# Patient Record
Sex: Female | Born: 1980 | Hispanic: No | Marital: Married | Smoking: Current every day smoker
Health system: Southern US, Community
[De-identification: ages and names within clinical notes are randomized; demographics above are authoritative.]

---

## 2005-12-11 ENCOUNTER — Ambulatory Visit: Payer: Self-pay | Admitting: Family Medicine

## 2006-04-04 ENCOUNTER — Ambulatory Visit: Payer: Self-pay | Admitting: Obstetrics and Gynecology

## 2006-06-22 ENCOUNTER — Observation Stay: Payer: Self-pay | Admitting: Certified Nurse Midwife

## 2006-06-24 ENCOUNTER — Inpatient Hospital Stay: Payer: Self-pay | Admitting: Obstetrics and Gynecology

## 2008-05-10 ENCOUNTER — Ambulatory Visit: Payer: Self-pay | Admitting: Certified Nurse Midwife

## 2008-06-21 ENCOUNTER — Ambulatory Visit: Payer: Self-pay | Admitting: Certified Nurse Midwife

## 2008-08-19 ENCOUNTER — Ambulatory Visit: Payer: Self-pay | Admitting: Family Medicine

## 2008-09-16 ENCOUNTER — Ambulatory Visit: Payer: Self-pay | Admitting: Family Medicine

## 2008-11-10 ENCOUNTER — Inpatient Hospital Stay: Payer: Self-pay | Admitting: Obstetrics and Gynecology

## 2015-11-20 ENCOUNTER — Ambulatory Visit
Admission: EM | Admit: 2015-11-20 | Discharge: 2015-11-20 | Disposition: A | Discharge status: Home / Self Care | Payer: Medicaid Other | Attending: Family Medicine | Admitting: Family Medicine

## 2015-11-20 ENCOUNTER — Encounter: Payer: Self-pay | Admitting: *Deleted

## 2015-11-20 DIAGNOSIS — R05 Cough: Secondary | ICD-10-CM | POA: Diagnosis present

## 2015-11-20 DIAGNOSIS — R509 Fever, unspecified: Secondary | ICD-10-CM | POA: Insufficient documentation

## 2015-11-20 DIAGNOSIS — B349 Viral infection, unspecified: Secondary | ICD-10-CM | POA: Diagnosis not present

## 2015-11-20 DIAGNOSIS — F17210 Nicotine dependence, cigarettes, uncomplicated: Secondary | ICD-10-CM | POA: Insufficient documentation

## 2015-11-20 LAB — RAPID INFLUENZA A&B ANTIGENS
Influenza A (ARMC): NEGATIVE
Influenza B (ARMC): NEGATIVE

## 2015-11-20 MED ORDER — OSELTAMIVIR PHOSPHATE 75 MG PO CAPS: 75.0000 mg | ORAL_CAPSULE | Freq: Two times a day (BID) | ORAL | Status: AC

## 2015-11-20 NOTE — ED Notes (Signed)
Fever up to 103, non-productive cough, body aches, runny nose headache since Saturday night. Pt's son dx with flu last week on Tamiflu.

## 2015-11-20 NOTE — ED Provider Notes (Signed)
CSN: [id number]     Arrival date & time 11/20/15  0940 History   First MD Initiated Contact with Patient 11/20/15 1048     Chief Complaint  Patient presents with  . Fever  . Cough  . Generalized Body Aches  . Nasal Congestion  . Headache   (Consider location/radiation/quality/duration/timing/severity/associated sxs/prior Treatment) Patient is a 35 y.o. female presenting with fever, cough, headaches, and URI. The history is provided by the patient.  Fever Associated symptoms: congestion, cough, headaches, myalgias and rhinorrhea   Cough Associated symptoms: fever, headaches, myalgias and rhinorrhea   Associated symptoms: no wheezing   Headache Associated symptoms: congestion, cough, fever, myalgias and URI   URI Presenting symptoms: congestion, cough, fever and rhinorrhea   Severity:  Moderate Onset quality:  Sudden Timing:  Constant Progression:  Worsening Chronicity:  New Relieved by:  Nothing Ineffective treatments:  OTC medications Associated symptoms: headaches and myalgias   Associated symptoms: no sinus pain and no wheezing   Risk factors: sick contacts (son confirmed flu case, diagnosed last week)   Risk factors: not elderly, no chronic cardiac disease, no chronic kidney disease, no chronic respiratory disease, no diabetes mellitus, no immunosuppression, no recent illness and no recent travel     History reviewed. No pertinent past medical history. History reviewed. No pertinent past surgical history. History reviewed. No pertinent family history. Social History  Substance Use Topics  . Smoking status: Current Every Day Smoker -- 0.25 packs/day    Types: Cigarettes  . Smokeless tobacco: None  . Alcohol Use: Yes   OB History    No data available     Review of Systems  Constitutional: Positive for fever.  HENT: Positive for congestion and rhinorrhea.   Respiratory: Positive for cough. Negative for wheezing.   Musculoskeletal: Positive for myalgias.   Neurological: Positive for headaches.    Allergies  Review of patient's allergies indicates no known allergies.  Home Medications   Prior to Admission medications   Medication Sig Start Date End Date Taking? Authorizing Provider  ibuprofen (ADVIL,MOTRIN) 400 MG tablet Take 400 mg by mouth every 6 (six) hours as needed.   Yes Historical Provider, MD  oseltamivir (TAMIFLU) 75 MG capsule Take 1 capsule (75 mg total) by mouth 2 (two) times daily. 11/20/15   Payton Mccallumrlando Eowyn Tabone, MD   Meds Ordered and Administered this Visit  Medications - No data to display  BP 116/67 mmHg  Pulse 93  Temp(Src) 98.4 F (36.9 C) (Oral)  Resp 16  Ht 5\' 6"  (1.676 m)  Wt 195 lb (88.451 kg)  BMI 31.49 kg/m2  SpO2 100%  LMP 07/23/2015 (Approximate) No data found.   Physical Exam  Constitutional: She appears well-developed and well-nourished. No distress.  HENT:  Head: Normocephalic.  Right Ear: Tympanic membrane, external ear and ear canal normal.  Left Ear: Tympanic membrane, external ear and ear canal normal.  Nose: Rhinorrhea present.  Mouth/Throat: Uvula is midline, oropharynx is clear and moist and mucous membranes are normal.  Eyes: Conjunctivae and EOM are normal. Pupils are equal, round, and reactive to light. Right eye exhibits no discharge. Left eye exhibits no discharge. No scleral icterus.  Neck: Normal range of motion. Neck supple. No JVD present. No tracheal deviation present. No thyromegaly present.  Cardiovascular: Normal rate, regular rhythm, normal heart sounds and intact distal pulses.   No murmur heard. Pulmonary/Chest: Effort normal and breath sounds normal. No stridor. No respiratory distress. She has no wheezes. She has no rales. She exhibits no  tenderness.  Lymphadenopathy:    She has no cervical adenopathy.  Skin: She is not diaphoretic.  Vitals reviewed.   ED Course  Procedures (including critical care time)  Labs Review Labs Reviewed  RAPID INFLUENZA A&B ANTIGENS [facility]  ONLY)    Imaging Review No results found.   Visual Acuity Review  Right Eye Distance:   Left Eye Distance:   Bilateral Distance:    Right Eye Near:   Left Eye Near:    Bilateral Near:         MDM   1. Viral syndrome   (likely flu)  Discharge Medication List as of 11/20/2015 11:11 AM    START taking these medications   Details  oseltamivir (TAMIFLU) 75 MG capsule Take 1 capsule (75 mg total) by mouth 2 (two) times daily., Starting 11/20/2015, Until Discontinued, Normal        1. diagnosis reviewed with patient 2. rx as per orders above; reviewed possible side effects, interactions, risks and benefits  3. Recommend supportive treatment with increased fluids, rest, otc analgesics 4. Follow-up prn if symptoms worsen or don't improve  Payton Mccallum, MD 11/20/15 1120

## 2015-12-20 ENCOUNTER — Other Ambulatory Visit: Payer: Self-pay | Admitting: Physician Assistant

## 2015-12-20 DIAGNOSIS — Z3490 Encounter for supervision of normal pregnancy, unspecified, unspecified trimester: Secondary | ICD-10-CM

## 2015-12-27 ENCOUNTER — Other Ambulatory Visit: Payer: Self-pay | Admitting: Physician Assistant

## 2015-12-27 ENCOUNTER — Ambulatory Visit
Admission: RE | Admit: 2015-12-27 | Discharge: 2015-12-27 | Disposition: A | Discharge status: Home / Self Care | Payer: Medicaid Other | Source: Ambulatory Visit | Attending: Physician Assistant | Admitting: Physician Assistant

## 2015-12-27 ENCOUNTER — Ambulatory Visit: Payer: Medicaid Other

## 2015-12-27 DIAGNOSIS — Z3490 Encounter for supervision of normal pregnancy, unspecified, unspecified trimester: Secondary | ICD-10-CM

## 2015-12-27 DIAGNOSIS — Z3A18 18 weeks gestation of pregnancy: Secondary | ICD-10-CM | POA: Diagnosis not present

## 2015-12-27 DIAGNOSIS — Z3492 Encounter for supervision of normal pregnancy, unspecified, second trimester: Secondary | ICD-10-CM | POA: Insufficient documentation

## 2017-11-25 ENCOUNTER — Other Ambulatory Visit: Payer: Self-pay

## 2017-11-25 ENCOUNTER — Emergency Department
Admission: EM | Admit: 2017-11-25 | Discharge: 2017-11-26 | Disposition: A | Discharge status: Home / Self Care | Payer: Medicaid Other | Attending: Emergency Medicine | Admitting: Emergency Medicine

## 2017-11-25 ENCOUNTER — Encounter: Payer: Self-pay | Admitting: Emergency Medicine

## 2017-11-25 ENCOUNTER — Emergency Department: Payer: Medicaid Other

## 2017-11-25 DIAGNOSIS — M542 Cervicalgia: Secondary | ICD-10-CM | POA: Insufficient documentation

## 2017-11-25 DIAGNOSIS — W2210XA Striking against or struck by unspecified automobile airbag, initial encounter: Secondary | ICD-10-CM | POA: Diagnosis not present

## 2017-11-25 DIAGNOSIS — Y998 Other external cause status: Secondary | ICD-10-CM | POA: Diagnosis not present

## 2017-11-25 DIAGNOSIS — R079 Chest pain, unspecified: Secondary | ICD-10-CM | POA: Insufficient documentation

## 2017-11-25 DIAGNOSIS — M79604 Pain in right leg: Secondary | ICD-10-CM | POA: Diagnosis not present

## 2017-11-25 DIAGNOSIS — F17210 Nicotine dependence, cigarettes, uncomplicated: Secondary | ICD-10-CM | POA: Insufficient documentation

## 2017-11-25 DIAGNOSIS — Y92410 Unspecified street and highway as the place of occurrence of the external cause: Secondary | ICD-10-CM | POA: Insufficient documentation

## 2017-11-25 DIAGNOSIS — R109 Unspecified abdominal pain: Secondary | ICD-10-CM | POA: Diagnosis not present

## 2017-11-25 DIAGNOSIS — Y9389 Activity, other specified: Secondary | ICD-10-CM | POA: Insufficient documentation

## 2017-11-25 LAB — CBC
HCT: 45.5 % (ref 35.0–47.0)
Hemoglobin: 14.9 g/dL (ref 12.0–16.0)
MCH: 26 pg (ref 26.0–34.0)
MCHC: 32.8 g/dL (ref 32.0–36.0)
MCV: 79.2 fL — AB (ref 80.0–100.0)
PLATELETS: 228 10*3/uL (ref 150–440)
RBC: 5.74 MIL/uL — ABNORMAL HIGH (ref 3.80–5.20)
RDW: 14.2 % (ref 11.5–14.5)
WBC: 19.3 10*3/uL — ABNORMAL HIGH (ref 3.6–11.0)

## 2017-11-25 LAB — BASIC METABOLIC PANEL
ANION GAP: 9 (ref 5–15)
BUN: 12 mg/dL (ref 6–20)
CHLORIDE: 111 mmol/L (ref 101–111)
CO2: 19 mmol/L — ABNORMAL LOW (ref 22–32)
Calcium: 9 mg/dL (ref 8.9–10.3)
Creatinine, Ser: 0.79 mg/dL (ref 0.44–1.00)
GFR calc Af Amer: >60 mL/min (ref >60)
Glucose, Bld: 104 mg/dL — ABNORMAL HIGH (ref 65–99)
POTASSIUM: 3.6 mmol/L (ref 3.5–5.1)
SODIUM: 139 mmol/L (ref 135–145)

## 2017-11-25 LAB — HCG, QUANTITATIVE, PREGNANCY

## 2017-11-25 MED ORDER — IOPAMIDOL (ISOVUE-300) INJECTION 61%
100.0000 mL | Freq: Once | INTRAVENOUS | Status: AC | PRN
  Administered 2017-11-25: 100 mL via INTRAVENOUS

## 2017-11-25 MED ORDER — HYDROCODONE-ACETAMINOPHEN 5-325 MG PO TABS: 1.0000 | ORAL_TABLET | ORAL | 0 refills | Status: AC | PRN

## 2017-11-25 MED ORDER — ONDANSETRON HCL 4 MG/2ML IJ SOLN
4.0000 mg | Freq: Once | INTRAMUSCULAR | Status: AC
  Administered 2017-11-25: 4 mg via INTRAVENOUS
  Filled 2017-11-25: qty 2

## 2017-11-25 MED ORDER — MORPHINE SULFATE (PF) 2 MG/ML IV SOLN
2.0000 mg | INTRAVENOUS | Status: DC | PRN
  Administered 2017-11-25 (×2): 2 mg via INTRAVENOUS
  Filled 2017-11-25 (×2): qty 1

## 2017-11-25 MED ORDER — SODIUM CHLORIDE 0.9 % IV BOLUS (SEPSIS)
1000.0000 mL | Freq: Once | INTRAVENOUS | Status: AC
  Administered 2017-11-25: 1000 mL via INTRAVENOUS
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
## 2017-11-25 NOTE — ED Notes (Signed)
ED Provider at bedside. 
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
## 2017-11-25 NOTE — ED Notes (Signed)
Patient transported to CT 

## 2017-11-25 NOTE — ED Notes (Signed)
Patient assisted to in-room toilet. Patient states she is sore already. Patient instructed to get dressed as EDP is working on discharge instructions for her. Husband is at bedside.

## 2018-01-08 ENCOUNTER — Ambulatory Visit: Payer: Medicaid Other | Attending: Nurse Practitioner

## 2018-01-08 DIAGNOSIS — M25561 Pain in right knee: Secondary | ICD-10-CM | POA: Insufficient documentation

## 2018-01-08 DIAGNOSIS — M25571 Pain in right ankle and joints of right foot: Secondary | ICD-10-CM | POA: Insufficient documentation

## 2018-01-08 DIAGNOSIS — M25512 Pain in left shoulder: Secondary | ICD-10-CM | POA: Insufficient documentation
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
## 2018-01-15 ENCOUNTER — Ambulatory Visit: Payer: Medicaid Other | Attending: Nurse Practitioner

## 2018-01-15 DIAGNOSIS — M25512 Pain in left shoulder: Secondary | ICD-10-CM | POA: Insufficient documentation

## 2018-01-15 DIAGNOSIS — M25561 Pain in right knee: Secondary | ICD-10-CM | POA: Insufficient documentation

## 2018-01-15 DIAGNOSIS — M25571 Pain in right ankle and joints of right foot: Secondary | ICD-10-CM | POA: Insufficient documentation

## 2018-01-26 ENCOUNTER — Ambulatory Visit: Payer: Medicaid Other

## 2018-01-26 DIAGNOSIS — M25571 Pain in right ankle and joints of right foot: Secondary | ICD-10-CM

## 2018-01-26 DIAGNOSIS — M25512 Pain in left shoulder: Secondary | ICD-10-CM

## 2018-01-26 DIAGNOSIS — M25561 Pain in right knee: Secondary | ICD-10-CM | POA: Diagnosis not present

## 2018-01-26 NOTE — Therapy (Signed)
[facility] [address] [place], [address] Phone: [phone]   Fax:  [phone]  Physical Therapy Treatment  Patient Details  Name: Jasmine Bird MRN: [id number] Date of Birth: Feb 01, 1981 Referring Provider: Elliot Gurney MD   Encounter Date: 01/26/2018  PT End of Session - 01/26/18 1439    Visit Number  2    Number of Visits  13    Date for PT Re-Evaluation  02/19/18    PT Start Time  1345    PT Stop Time  1435    PT Time Calculation (min)  50 min    Activity Tolerance  Patient tolerated treatment well    Behavior During Therapy  [facility] for tasks assessed/performed       History reviewed. No pertinent past medical history.  History reviewed. No pertinent surgical history.  There were no vitals filed for this visit.  Subjective Assessment - 01/26/18 1414    Subjective  Patient reports her knee and her ankle are around 50% better compared to the intial visit and the shoulder is 75% improved compared to previous visit. Patient is no longer uses an AD for ambulation.    Limitations  Lifting;Standing;Walking    Diagnostic tests  MRI and X ray     Patient Stated Goals  Return to recreational activities; lifting daughter    Currently in Pain?  Yes    Pain Score  4     Pain Location  Knee ankle/knee/shoulder    Pain Orientation  Right;Left    Pain Descriptors / Indicators  Aching    Pain Type  Chronic pain    Pain Onset  More than a month ago         TREATMENT: Therapeutic Exercise:  Feet together balance on airex beam head up/down; left/right - x 15 Ankle circles in sitting - x 20 cw/ccw Ambulation with focusing on improve heel strikes  and balance - x66ft x 2  Heel raises in standing with UE support - x 15 Hip abduction in standing - 3 x 10  Shoulder extension at end range flexion - 5sec x 10  Patient demonstrates increased fatigue at the end of the session    PT Education - 01/26/18 1436    Education provided  Yes    Education Details  HEP: shoulder IR, hip abduction, heel raises    Person(s) Educated  Patient    Methods  Demonstration;Explanation    Comprehension  Verbalized understanding;Returned demonstration          PT Long Term Goals - 01/08/18 2033      PT LONG TERM GOAL #1   Title  Patient will be independent with HEP to continue benefits of therapy after discharge.    Baseline  Dependent with form and technique    Time  6    Period  Weeks    Status  New    Target Date  02/19/18      PT LONG TERM GOAL #2   Title  Patient will be able to walk safely without the need of an AD to better be able to ambulate community distrances without difficulty    Baseline  ambulates with crutches    Time  6    Period  Weeks    Status  New    Target Date  02/19/18      PT LONG TERM GOAL #3   Title  Patient will improve her LEFS score to over  60/80 to indicate improvement with knee function and decreased pain.     Baseline  8/80    Time  6    Period  Weeks    Status  New    Target Date  02/19/18      PT LONG TERM GOAL #4   Title  Patient will have a worst pain of 2/10 to indicate significant decrease in pain and greater ability to perform household activites with more comfort.     Baseline  8/10 worst pain    Time  6    Period  Weeks    Status  New    Target Date  02/19/18            Plan - 01/26/18 1441    Clinical Impression Statement  Patient demonstrates improvement with exercises with ability to perform greater amount of exercises in standing compared to previous sessions. Patient requires constant siting rest breaks secondary to increased pain with performing standing exercises. Patient will benefit from further skilled therapy to return to prior level of function.     Rehab Potential  Fair    Clinical Impairments Affecting Rehab Potential  (+) highly motivated (-) multiple affected areas    PT Frequency  2x / week    PT Duration  6 weeks    PT  Treatment/Interventions  Electrical Stimulation;Cryotherapy;Iontophoresis /ml Dexamethasone;Moist Heat;Ultrasound;Therapeutic activities;Therapeutic exercise;Gait training;Balance training;Neuromuscular re-education;Patient/family education;Manual techniques;Passive range of motion;Dry needling    PT Next Visit Plan  progress strengthening and AROM    PT Home Exercise Plan  see education section    Consulted and Agree with Plan of Care  Patient       Patient will benefit from skilled therapeutic intervention in order to improve the following deficits and impairments:  Abnormal gait, Impaired sensation, Pain, Increased muscle spasms, Decreased coordination, Decreased endurance, Decreased range of motion, Decreased activity tolerance, Decreased strength, Decreased balance, Difficulty walking  Visit Diagnosis: Acute pain of right knee  Acute right ankle pain  Acute pain of left shoulder     Problem List There are no active problems to display for this patient.   Myrene Galas, PT DPT 01/26/2018, 2:44 PM  [facility] PHYSICAL AND SPORTS MEDICINE [address], [place], [address] Phone: [phone]   Fax:  [phone]  Name: SPRING SAN MRN: [id number] Date of Birth: May 06, 1981

## 2018-01-28 ENCOUNTER — Ambulatory Visit: Payer: Medicaid Other

## 2018-02-02 ENCOUNTER — Ambulatory Visit: Payer: Medicaid Other

## 2018-02-04 ENCOUNTER — Ambulatory Visit: Payer: Medicaid Other

## 2018-02-04 DIAGNOSIS — M25571 Pain in right ankle and joints of right foot: Secondary | ICD-10-CM

## 2018-02-04 DIAGNOSIS — M25561 Pain in right knee: Secondary | ICD-10-CM

## 2018-02-04 DIAGNOSIS — M25512 Pain in left shoulder: Secondary | ICD-10-CM

## 2018-02-04 NOTE — Therapy (Signed)
[facility] [address], [place], [address] Phone: [phone]   Fax:  [phone]  Physical Therapy Treatment  Patient Details  Name: Jasmine Bird MRN: [id number] Date of Birth: [birth year] Referring Provider: Elliot Gurney MD   Encounter Date: 02/04/2018  PT End of Session - 02/04/18 1718    Visit Number  3    Number of Visits  13    Date for PT Re-Evaluation  02/19/18    PT Start Time  1630    PT Stop Time  1715    PT Time Calculation (min)  45 min    Activity Tolerance  Patient tolerated treatment well    Behavior During Therapy  [facility] for tasks assessed/performed       History reviewed. No pertinent past medical history.  History reviewed. No pertinent surgical history.  There were no vitals filed for this visit.  Subjective Assessment - 02/04/18 1654    Subjective  Patient reports she moving more and is improving with her functional exercises such as working in the yard. Patient reports the shoulder is much improved.     Limitations  Lifting;Standing;Walking    Diagnostic tests  MRI and X ray     Patient Stated Goals  Return to recreational activities; lifting daughter    Currently in Pain?  Yes    Pain Score  5     Pain Location  -- Ankle and knee     Pain Orientation  Right    Pain Type  Chronic pain    Pain Onset  More than a month ago    Pain Frequency  Constant        TREATMENT: Therapeutic Exercise:  Feet together toe flexion - x 20  Hip abduction in standing on airex pad -  x 20   Heel raises in standing with UE support on the airex pad - x 20 Ankle dorsiflexion, inversion, eversion - 2 x 20 Hip extension in standing on airex pad - x 20  SLS in standing on airex pad - 2 x 30sec B Step ups onto 4" with airex pad on top - x 20 Side stepping with YTB around the knees - 2 x 56ft B Wedding marches with YTB around the knees - 2 x 28ft B  Step ups onto 6" step with weight shift - x 20   Single leg leg press - 2 x 20 35#  Patient demonstrates increased fatigue at the end of the session   PT Education - 02/04/18 1659    Education provided  Yes    Education Details  HEP: ankle motions in all motions    Person(s) Educated  Patient    Methods  Explanation;Demonstration;Handout    Comprehension  Verbalized understanding;Returned demonstration          PT Long Term Goals - 01/08/18 2033      PT LONG TERM GOAL #1   Title  Patient will be independent with HEP to continue benefits of therapy after discharge.    Baseline  Dependent with form and technique    Time  6    Period  Weeks    Status  New    Target Date  02/19/18      PT LONG TERM GOAL #2   Title  Patient will be able to walk safely without the need of an AD to better be able to ambulate community distrances without difficulty    Baseline  ambulates  with crutches    Time  6    Period  Weeks    Status  New    Target Date  02/19/18      PT LONG TERM GOAL #3   Title  Patient will improve her LEFS score to over 60/80 to indicate improvement with knee function and decreased pain.     Baseline  8/80    Time  6    Period  Weeks    Status  New    Target Date  02/19/18      PT LONG TERM GOAL #4   Title  Patient will have a worst pain of 2/10 to indicate significant decrease in pain and greater ability to perform household activites with more comfort.     Baseline  8/10 worst pain    Time  6    Period  Weeks    Status  New    Target Date  02/19/18            Plan - 02/04/18 1719    Clinical Impression Statement  Patient demonstrates improvement with exercise performance with ability to perform greater amount of higher level exercises compared to previous sessions indicating functional carryover between sessions. Although patient is improving, she demonstrates increased pain with perform greater knee flexion in standing with squats indicating poor muscular strength and coordination. Patient will  benefit from further skilled therapy to return to prior level of function.     Rehab Potential  Fair    Clinical Impairments Affecting Rehab Potential  (+) highly motivated (-) multiple affected areas    PT Frequency  2x / week    PT Duration  6 weeks    PT Treatment/Interventions  Electrical Stimulation;Cryotherapy;Iontophoresis /ml Dexamethasone;Moist Heat;Ultrasound;Therapeutic activities;Therapeutic exercise;Gait training;Balance training;Neuromuscular re-education;Patient/family education;Manual techniques;Passive range of motion;Dry needling    PT Next Visit Plan  progress strengthening and AROM    PT Home Exercise Plan  see education section    Consulted and Agree with Plan of Care  Patient       Patient will benefit from skilled therapeutic intervention in order to improve the following deficits and impairments:  Abnormal gait, Impaired sensation, Pain, Increased muscle spasms, Decreased coordination, Decreased endurance, Decreased range of motion, Decreased activity tolerance, Decreased strength, Decreased balance, Difficulty walking  Visit Diagnosis: Acute pain of right knee  Acute right ankle pain  Acute pain of left shoulder     Problem List There are no active problems to display for this patient.   Myrene Galas, PT DPT 02/04/2018, 5:22 PM  [facility] PHYS[facility] AND SPORTS MEDICINE [address], [place], [address] Phone: [phone]   Fax:  [phone]  Name: TIFFANEE MCNEE MRN: [id number] Date of Birth: May 09, 1981

## 2018-02-11 ENCOUNTER — Ambulatory Visit: Payer: Medicaid Other

## 2018-02-17 ENCOUNTER — Ambulatory Visit: Payer: Medicaid Other | Attending: Nurse Practitioner

## 2018-02-17 DIAGNOSIS — M25512 Pain in left shoulder: Secondary | ICD-10-CM | POA: Insufficient documentation

## 2018-02-17 DIAGNOSIS — M25561 Pain in right knee: Secondary | ICD-10-CM | POA: Insufficient documentation

## 2018-02-17 DIAGNOSIS — M25571 Pain in right ankle and joints of right foot: Secondary | ICD-10-CM | POA: Insufficient documentation

## 2018-02-17 NOTE — Therapy (Signed)
[facility] [address] [place], [place], [address] Phone: [phone]   Fax:  [phone]  Physical Therapy Treatment  Patient Details  Name: Jasmine Bird MRN: [id number] Date of Birth: 12/29/1980 Referring Provider: Elliot GurneyWoody MD   Encounter Date: 02/17/2018  PT End of Session - 02/17/18 1513    Visit Number  4    Number of Visits  13    Date for PT Re-Evaluation  02/19/18    PT Start Time  1430    PT Stop Time  1515    PT Time Calculation (min)  45 min    Activity Tolerance  Patient tolerated treatment well    Behavior During Therapy  [facility] for [facility] assessed/performed       History reviewed. No pertinent past medical history.  History reviewed. No pertinent surgical history.  There were no vitals filed for this visit.  Subjective Assessment - 02/17/18 1439    Subjective  Patient reports increased difficulty with performing squatting motions and has had increased pain with squatting and standing on one foot. Patient reports the worst pain she had was a 7/10.    Limitations  Lifting;Standing;Walking    Diagnostic tests  MRI and X ray     Patient Stated Goals  Return to recreational activities; lifting daughter    Currently in Pain?  Yes    Pain Score  6     Pain Location  -- R hip and ankle    Pain Orientation  Right    Pain Descriptors / Indicators  Aching    Pain Type  Chronic pain    Pain Onset  More than a month ago    Pain Frequency  Constant       TREATMENT  Therapeutic Exercise Running man in standing on airex pad - x 20 SLS in standing - 2 x 30 sec  SLS in standing on airex pad - 2 x 30sec  Single leg mini squats - 2 x 15  Step ups onto the stairs (4 stairs) - x 10 without UE support  High step up with following a high knee - 2x 15  SL DL in standing - x 20 B  Side stepping onto 10 inch step - x 25  Patient demonstrates increased fatigue with exercise    PT Education - 02/17/18 1452    Education provided  Yes    Education Details  HEP: squatting    Person(s) Educated  Patient    Methods  Explanation;Demonstration    Comprehension  Verbalized understanding;Returned demonstration          PT Long Term Goals - 02/17/18 1559      PT LONG TERM GOAL #1   Title  Patient will be independent with HEP to continue benefits of therapy after discharge.    Baseline  Dependent with form and technique; 02/17/2018: minimal cueing for exercise performance    Time  6    Period  Weeks    Status  On-going      PT LONG TERM GOAL #2   Title  Patient will be able to walk safely without the need of an AD to better be able to ambulate community distrances without difficulty    Baseline  ambulates with crutches; 02/17/2018: Can ambulate without AD     Time  6    Period  Weeks    Status  Achieved      PT LONG TERM GOAL #3  Title  Patient will improve her LEFS score to over 60/80 to indicate improvement with knee function and decreased pain.     Baseline  8/80; 02/17/2018: 35/80    Time  6    Period  Weeks    Status  On-going      PT LONG TERM GOAL #4   Title  Patient will have a worst pain of 2/10 to indicate significant decrease in pain and greater ability to perform household activites with more comfort.     Baseline  8/10 worst pain; 02/17/2018: 7/10    Time  6    Period  Weeks    Status  On-going            Plan - 02/17/18 1604    Clinical Impression Statement  Patient demonstrates improvement with LEFS, worst pain score, and ambulation without an AD indicating functional improvement towards long term goals. Patient also has little to no pain with use of her L shoulder with over head motions. Patient continues to have increased pain with single leg stance and strengthening. Patient will benefit from further skilled therapy to return to prior level of function.     Rehab Potential  Fair    Clinical Impairments Affecting Rehab Potential  (+) highly motivated (-) multiple affected  areas    PT Frequency  2x / week    PT Duration  6 weeks    PT Treatment/Interventions  Electrical Stimulation;Cryotherapy;Iontophoresis 4mg /ml Dexamethasone;Moist Heat;Ultrasound;Therapeutic activities;Therapeutic exercise;Gait training;Balance training;Neuromuscular re-education;Patient/family education;Manual techniques;Passive range of motion;Dry needling    PT Next Visit Plan  progress strengthening and AROM    PT Home Exercise Plan  see education section    Consulted and Agree with Plan of Care  Patient       Patient will benefit from skilled therapeutic intervention in order to improve the following deficits and impairments:  Abnormal gait, Impaired sensation, Pain, Increased muscle spasms, Decreased coordination, Decreased endurance, Decreased range of motion, Decreased activity tolerance, Decreased strength, Decreased balance, Difficulty walking  Visit Diagnosis: Acute pain of right knee  Acute right ankle pain  Acute pain of left shoulder     Problem List There are no active problems to display for this patient.   Myrene Galas, PT DPT 02/17/2018, 4:12 PM  [facility] PHYS[facility] SPORTS MEDICINE [address], [place], [address] Phone: [phone]   Fax:  [phone]  Name: Jasmine Bird MRN: [id number] Date of Birth: 1981-04-25

## 2018-02-24 ENCOUNTER — Ambulatory Visit: Payer: Medicaid Other

## 2018-03-03 ENCOUNTER — Ambulatory Visit: Payer: Medicaid Other

## 2018-03-03 DIAGNOSIS — M25561 Pain in right knee: Secondary | ICD-10-CM | POA: Diagnosis not present

## 2018-03-03 DIAGNOSIS — M25571 Pain in right ankle and joints of right foot: Secondary | ICD-10-CM

## 2018-03-03 NOTE — Therapy (Signed)
[facility] PHYSICAL [facility] SPORTS MEDICINE [address] [place], [address] Phone: [phone]   Fax:  [phone]  Physical Therapy Treatment  Patient Details  Name: Jasmine Bird MRN: [id number] Date of Birth: 1981/06/24 Referring Provider: Elliot Gurney MD   Encounter Date: 03/03/2018  PT End of Session - 03/03/18 1118    Visit Number  5    Number of Visits  13    Date for PT Re-Evaluation  03/31/18    PT Start Time  1030    PT Stop Time  1114    PT Time Calculation (min)  44 min    Activity Tolerance  Patient tolerated treatment well    Behavior During Therapy  [facility] for tasks assessed/performed       History reviewed. No pertinent past medical history.  History reviewed. No pertinent surgical history.  There were no vitals filed for this visit.  Subjective Assessment - 03/03/18 1034    Subjective  Pt reports that knee is feeling better but that ankle is feeling a little sore. 3/10 in ankle, dull ache. Pt also reports that she feels her balance needs improvement. Patient reports the knee pain is much lower compared to the previous sessions.    Diagnostic tests  MRI and X ray     Patient Stated Goals  Return to recreational activities; lifting daughter    Currently in Pain?  Yes    Pain Score  3     Pain Location  Ankle    Pain Orientation  Right    Pain Descriptors / Indicators  Aching    Pain Type  Chronic pain    Pain Onset  More than a month ago    Pain Frequency  Constant        TREATMENT Therapeutic Exercises SL squats to elevated table height--10x4 verbal cues required for proper form, half foam roll used for visual cue Bosu ball SL stance 30sx5 BLE -- improved with practice, able to decrease UE support Rebounder lateral toss with one LE on balance stone 15x2 Lunges onto bosu ball 15x2 Crab walk with RTB around ankles 22ft x2 SLS on airex with ball toss x20 B  No increased pain with therapeutic exercises. Patient  tolerated treatment well.    PT Education - 03/03/18 1114    Education provided  Yes    Education Details  Pt educated on proper form and technique with therapeutc exercises.    Person(s) Educated  Patient    Methods  Explanation;Demonstration    Comprehension  Verbalized understanding;Returned demonstration          PT Long Term Goals - 02/17/18 1559      PT LONG TERM GOAL #1   Title  Patient will be independent with HEP to continue benefits of therapy after discharge.    Baseline  Dependent with form and technique; 02/17/2018: minimal cueing for exercise performance    Time  6    Period  Weeks    Status  On-going      PT LONG TERM GOAL #2   Title  Patient will be able to walk safely without the need of an AD to better be able to ambulate community distrances without difficulty    Baseline  ambulates with crutches; 02/17/2018: Can ambulate without AD     Time  6    Period  Weeks    Status  Achieved      PT LONG TERM GOAL #3   Title  Patient will improve her LEFS score to over 60/80 to indicate improvement with knee function and decreased pain.     Baseline  8/80; 02/17/2018: 35/80    Time  6    Period  Weeks    Status  On-going      PT LONG TERM GOAL #4   Title  Patient will have a worst pain of 2/10 to indicate significant decrease in pain and greater ability to perform household activites with more comfort.     Baseline  8/10 worst pain; 02/17/2018: 7/10    Time  6    Period  Weeks    Status  On-going            Plan - 03/03/18 1119    Clinical Impression Statement  Patient demonstrates improved toelrance to higher level balance exercises and LE strengthening exercises. Pt is able to complete all exrecises with no pain in knee or ankle. with balance exercises, pt improved with practice occasionally requiring UE support during balance exercises indicating poor balance. Patient will continue to benefit from skilled PT in order return to prior level of function.     Rehab Potential  Fair    Clinical Impairments Affecting Rehab Potential  (+) highly motivated (-) multiple affected areas    PT Frequency  2x / week    PT Duration  6 weeks    PT Treatment/Interventions  Electrical Stimulation;Cryotherapy;Iontophoresis 4mg /ml Dexamethasone;Moist Heat;Ultrasound;Therapeutic activities;Therapeutic exercise;Gait training;Balance training;Neuromuscular re-education;Patient/family education;Manual techniques;Passive range of motion;Dry needling    PT Next Visit Plan  Higher level balance exercises and LE strengthening incorporating ankle stability     PT Home Exercise Plan  see education section       Patient will benefit from skilled therapeutic intervention in order to improve the following deficits and impairments:  Abnormal gait, Impaired sensation, Pain, Increased muscle spasms, Decreased coordination, Decreased endurance, Decreased range of motion, Decreased activity tolerance, Decreased strength, Decreased balance, Difficulty walking  Visit Diagnosis: Acute right ankle pain  Acute pain of right knee     Problem List There are no active problems to display for this patient.   Mellody DanceKaren Cyndra Feinberg, SPT 03/03/2018, 3:56 PM  [facility]IC[facility] [address] [place], [place], [address] Phone: [phone]   Fax:  [phone]  Name: Jasmine Bird MRN: [id number] Date of Birth: 1981/07/05

## 2018-03-10 ENCOUNTER — Ambulatory Visit: Payer: Medicaid Other

## 2018-03-17 ENCOUNTER — Ambulatory Visit: Payer: Medicaid Other | Attending: Nurse Practitioner

## 2018-03-17 DIAGNOSIS — M25571 Pain in right ankle and joints of right foot: Secondary | ICD-10-CM | POA: Diagnosis not present

## 2018-03-17 DIAGNOSIS — M25512 Pain in left shoulder: Secondary | ICD-10-CM | POA: Diagnosis present

## 2018-03-17 DIAGNOSIS — M25561 Pain in right knee: Secondary | ICD-10-CM | POA: Insufficient documentation

## 2018-03-17 NOTE — Therapy (Signed)
[facility] PH[facility] AND [facility] MEDICINE [address] [place], [place], [address] Phone: [phone]   Fax:  [phone]  Physical Therapy Treatment  Patient Details  Name: Jasmine Bird MRN: [id number] Date of Birth: 06/20/1981 Referring Provider: Elliot GurneyWoody MD   Encounter Date: 03/17/2018  PT End of Session - 03/17/18 1647    Visit Number  6    Number of Visits  13    Date for PT Re-Evaluation  03/31/18    PT Start Time  1115    PT Stop Time  1200    PT Time Calculation (min)  45 min    Activity Tolerance  Patient tolerated treatment well    Behavior During Therapy  [facility] for tasks assessed/performed       History reviewed. No pertinent past medical history.  History reviewed. No pertinent surgical history.  There were no vitals filed for this visit.  Subjective Assessment - 03/17/18 1643    Subjective  Pt states that she currently has "a lot going on emotionally". Pt also states that ankle has been sore over the past week and that the knee occasionally "pops".     Limitations  Lifting;Standing;Walking    Diagnostic tests  MRI and X ray     Patient Stated Goals  Return to recreational activities; lifting daughter    Currently in Pain?  Yes    Pain Score  6     Pain Location  Ankle    Pain Orientation  Right    Pain Descriptors / Indicators  Aching    Pain Type  Chronic pain    Pain Onset  More than a month ago         TREATMENT Therapeutic Exercise SL squat to elevated surface x20 B Squats with GTB around knees to increase abduction of knees x20 Airex beam+board x5 Bosu ball lunges x20 B SL clock exercise x5 B Tandem, SL balance ball toss x15 Heel taps off 4" step x20 B Rockerboard decr UE support x3 min    Patient demonstrates fatigue at end of session. No increase in pain throughout therapeutic exercises.     PT Education - 03/17/18 1646    Education provided  Yes    Education Details  patient educated on proper  form and technique for therapeutic exercises.    Person(s) Educated  Patient    Methods  Explanation;Demonstration    Comprehension  Verbalized understanding;Returned demonstration          PT Long Term Goals - 02/17/18 1559      PT LONG TERM GOAL #1   Title  Patient will be independent with HEP to continue benefits of therapy after discharge.    Baseline  Dependent with form and technique; 02/17/2018: minimal cueing for exercise performance    Time  6    Period  Weeks    Status  On-going      PT LONG TERM GOAL #2   Title  Patient will be able to walk safely without the need of an AD to better be able to ambulate community distrances without difficulty    Baseline  ambulates with crutches; 02/17/2018: Can ambulate without AD     Time  6    Period  Weeks    Status  Achieved      PT LONG TERM GOAL #3   Title  Patient will improve her LEFS score to over 60/80 to indicate improvement with knee function and decreased pain.  Baseline  8/80; 02/17/2018: 35/80    Time  6    Period  Weeks    Status  On-going      PT LONG TERM GOAL #4   Title  Patient will have a worst pain of 2/10 to indicate significant decrease in pain and greater ability to perform household activites with more comfort.     Baseline  8/10 worst pain; 02/17/2018: 7/10    Time  6    Period  Weeks    Status  On-going            Plan - 03/17/18 1653    Clinical Impression Statement  Patient continues to demonstrate improved tolerance to higher level LE strenghtening exercises. Pt continues to demonstrate decreased stability with balance exercises but is able to perfrom more activities (airex ball toss, SL RDL) without UE support. Patient will continue to benefit from skilled PT in order to return to prior level of function.    Rehab Potential  Fair    Clinical Impairments Affecting Rehab Potential  (+) highly motivated (-) multiple affected areas    PT Frequency  2x / week    PT Treatment/Interventions   Electrical Stimulation;Cryotherapy;Iontophoresis 4mg /ml Dexamethasone;Moist Heat;Ultrasound;Therapeutic activities;Therapeutic exercise;Gait training;Balance training;Neuromuscular re-education;Patient/family education;Manual techniques;Passive range of motion;Dry needling    PT Next Visit Plan  Higher level balance exercises and LE strengthening incorporating ankle stability     PT Home Exercise Plan  see education section    Consulted and Agree with Plan of Care  Patient       Patient will benefit from skilled therapeutic intervention in order to improve the following deficits and impairments:  Abnormal gait, Impaired sensation, Pain, Increased muscle spasms, Decreased coordination, Decreased endurance, Decreased range of motion, Decreased activity tolerance, Decreased strength, Decreased balance, Difficulty walking  Visit Diagnosis: Acute right ankle pain  Acute pain of right knee  Acute pain of left shoulder     Problem List There are no active problems to display for this patient.   Mellody Dance, SPT 03/17/2018, 4:57 PM  [facility] PH[facility]ICAL AND SPORTS MEDICINE [address], [place], [address] Phone: [phone]   Fax:  [phone]  Name: Jasmine Bird MRN: [id number] Date of Birth: 07-28-1981

## 2018-03-23 ENCOUNTER — Ambulatory Visit: Payer: Medicaid Other

## 2018-03-30 ENCOUNTER — Ambulatory Visit: Payer: Medicaid Other

## 2018-04-01 ENCOUNTER — Ambulatory Visit: Payer: Medicaid Other

## 2018-04-06 ENCOUNTER — Ambulatory Visit: Payer: Medicaid Other
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
